# Patient Record
Sex: Female | Born: 1950 | Race: White | Hispanic: No | Smoking: Current every day smoker
Health system: Southern US, Community
[De-identification: ages and names within clinical notes are randomized; demographics above are authoritative.]

## PROBLEM LIST (undated history)

## (undated) DIAGNOSIS — M79606 Pain in leg, unspecified: Secondary | ICD-10-CM

## (undated) DIAGNOSIS — F319 Bipolar disorder, unspecified: Secondary | ICD-10-CM

## (undated) DIAGNOSIS — I8390 Asymptomatic varicose veins of unspecified lower extremity: Secondary | ICD-10-CM

## (undated) HISTORY — DX: Bipolar disorder, unspecified: F31.9

## (undated) HISTORY — DX: Pain in leg, unspecified: M79.606

## (undated) HISTORY — DX: Asymptomatic varicose veins of unspecified lower extremity: I83.90

---

## 1982-07-16 HISTORY — PX: ABDOMINAL HYSTERECTOMY: SHX81

## 1998-07-16 HISTORY — PX: CARPAL TUNNEL RELEASE: SHX101

## 2011-04-20 ENCOUNTER — Encounter: Payer: Self-pay | Admitting: Surgery

## 2011-04-23 ENCOUNTER — Ambulatory Visit (INDEPENDENT_AMBULATORY_CARE_PROVIDER_SITE_OTHER): Payer: BC Managed Care – PPO | Admitting: Vascular Surgery

## 2011-04-23 ENCOUNTER — Ambulatory Visit (INDEPENDENT_AMBULATORY_CARE_PROVIDER_SITE_OTHER): Payer: BC Managed Care – PPO | Admitting: Surgery

## 2011-04-23 ENCOUNTER — Encounter: Payer: Self-pay | Admitting: Surgery

## 2011-04-23 VITALS — BP 130/76 | HR 68 | Resp 22 | Ht 64.0 in | Wt 140.0 lb

## 2011-04-23 DIAGNOSIS — M79609 Pain in unspecified limb: Secondary | ICD-10-CM

## 2011-04-23 DIAGNOSIS — M7989 Other specified soft tissue disorders: Secondary | ICD-10-CM

## 2011-04-23 DIAGNOSIS — I83893 Varicose veins of bilateral lower extremities with other complications: Secondary | ICD-10-CM

## 2011-04-23 NOTE — Progress Notes (Signed)
Vascular and Vein Specialist of [facility]   Patient name: Joyce Reyes MRN: [id number] DOB: 02-20-1951 Sex: female   Referred by: Dr. Leonor Liv  Reason for referral:  Chief Complaint  Patient presents with  . New Evaluation    VV  new patient eval   REF-->>  Dr. Juleen China,   Pt has been wearing compression hose.    HISTORY OF PRESENT ILLNESS: This is a 60 year old female I am seeing in consultation for evaluation of leg swelling and painful for her gross veins. She has a history of undergoing sclerotherapy in the left leg many years ago which have improved cosmetic appearance of her left leg however in the left leg she does does complain of significant swelling particularly at the end of the day and pain behind her left knee where she develops prominent varicosities. She has never undergone laser ablation in the past. More recently within the past 3 months she has developed pain in the right leg. She describes as burning and throbbing. She has tried taking ibuprofen 600 mg 3 times a day without benefit. She does have bilateral thigh-high compression stockings 20-30 mm compression grade which she has been wearing without significant relief. She states that in today she's gained approximately 10 pounds of fluid weight. She cannot get her pants on in the afternoon that she were to morning because of fluid buildup  Past Medical History  Diagnosis Date  . Varicose veins   . Leg pain   . Bipolar depression     Past Surgical History  Procedure Date  . Abdominal hysterectomy 1984    vaginal hysterectomy  . Carpal tunnel release 2000    Bilateral    History   Social History  . Marital Status: Unknown    Spouse Name: N/A    Number of Children: N/A  . Years of Education: N/A   Occupational History  . Not on file.   Social History Main Topics  . Smoking status: Current Everyday Smoker -- 0.5 packs/day for 40 years    Types: Cigarettes  . Smokeless tobacco: Not on file  . Alcohol Use:  No  . Drug Use: No  . Sexually Active: Not on file   Other Topics Concern  . Not on file   Social History Narrative  . No narrative on file    History reviewed. No pertinent family history.  Allergies as of 04/23/2011 - Review Complete 04/23/2011  Allergen Reaction Noted  . Adhesive (tape)  04/20/2011    Current Outpatient Prescriptions on File Prior to Visit  Medication Sig Dispense Refill  . Cholecalciferol (VITAMIN D3) 5000 UNITS CAPS Take 1 tablet by mouth daily.        . Multiple Vitamins-Minerals (WOMENS 50+ ADVANCED PO) Take 1 tablet by mouth daily.        . nicotine (NICOTROL) 10 MG inhaler Inhale 1 puff into the lungs 6 (six) times daily.        . Omega-3 Fatty Acids (FISH OIL) 1000 MG CAPS Take 4 capsules by mouth daily.       . vitamin B-12 (CYANOCOBALAMIN) 1000 MCG tablet Take 1,000 mcg by mouth daily.           REVIEW OF SYSTEMS: Cardiovascular: No chest pain, chest pressure, palpitations, orthopnea, or dyspnea on exertion. No claudication or rest pain,  No history of DVT Pulmonary: No productive cough, asthma or wheezing. Neurologic: No weakness, paresthesias, aphasia, or amaurosis. No dizziness. Hematologic: No bleeding problems or clotting disorders. Musculoskeletal:  No joint pain or joint swelling. Gastrointestinal: No blood in stool or hematemesis Genitourinary: No dysuria or hematuria. Psychiatric:: Positive for depression Integumentary: No rashes or ulcers. Constitutional: No fever or chills.  PHYSICAL EXAMINATION: General: The patient appears their stated age.  Vital signs are BP 130/76  Pulse 68  Resp 22  Ht 5\' 4"  (1.626 m)  Wt 140 lb (63.504 kg)  BMI 24.03 kg/m2  SpO2 99% Pulmonary: There is a good air exchange bilaterally without wheezing or rales. Abdomen: Soft and non-tender with normal pitch bowel sounds. Musculoskeletal: There are no major deformities.  There is no significant extremity pain. Neurologic: No focal weakness or paresthesias  are detected, Skin: There are no ulcer or rashes noted. Psychiatric: The patient has normal affect. Cardiovascular: There is a regular rate and rhythm without significant murmur appreciated. No carotid bruits VEIN: Patient has prominent varicosities on the right anterior medial lower leg and the left posterior medial leg. There is an area of warm induration on the right medial calf which is very tender to touch  Diagnostic Studies: Reflux evaluation exam was performed today which shows the diameter of the right greater saphenous vein to be between 25 and 0.7 cm the left measures between 0.4 and 0.5 cm there is mild reflux in the right deep system with no reflux in the left deep system there is reflux in the right greater saphenous vein to the saphenofemoral junction distally there is reflux in the left greater saphenous vein around the knee  Outside Studies/Documentation Historical records were reviewed.  They showed thrombophlebitis of the right leg with significant lower extremity edema and varicosities varicosities  Medication Changes: None  Assessment:  Bilateral reflux with thrombophlebitis on the right Plan: For thrombophlebitis I recommended warm compresses as well as ibuprofen 600 mg 3 times daily. In addition we'll make sure that she wears her 20-30 mm gradient thigh-high compression stockings to help with her swelling. She will come back for reevaluation in 3 months to see Dr. Hart Rochester for consideration of endovenous laser ablation and phlebectomy of her varicosities     V. Charlena Cross, M.D. [facility] [place] Office: [phone]

## 2011-04-25 ENCOUNTER — Encounter: Payer: Self-pay | Admitting: Vascular Surgery

## 2011-04-30 NOTE — Procedures (Unsigned)
LOWER EXTREMITY VENOUS REFLUX EXAM  INDICATION:  Lower extremity pain, swelling, and varicose veins.  EXAM:  Using color-flow imaging and pulse Doppler spectral analysis, the bilateral common femoral, superficial femoral, popliteal, posterior tibial, greater and lesser saphenous veins are evaluated.  There is evidence suggesting deep venous insufficiency in the right lower extremity.  There is no evidence suggesting deep venous insufficiency in the left lower extremity.  The right saphenofemoral junction is not competent with Reflux of >561milliseconds. The left saphenofemoral junction is competent.  The right and left GSV's are not competent with Reflux of >543milliseconds with the caliber as described below.   The right proximal short saphenous vein demonstrates competency.  The left proximal short saphenous vein demonstrates incompetency with diameter measurements ranging from 0.25 cm to 0.27 cm.  GSV Diameter (used if found to be incompetent only)                                                     Right      Left Proximal Greater Saphenous Vein                     0.70 cm    0.54 cm Proximal-to-mid-thigh                               0.48 cm    0.33 cm Mid thigh                                           0.58 cm    0.38 cm Mid-distal thigh                                    cm         cm Distal thigh                                        0.56 cm    0.39 cm Knee                                                0.85 cm    0.32 cm  IMPRESSION: 1. The bilateral greater saphenous veins are not competent with reflux     >559milliseconds. 2. The bilateral great saphenous veins are not tortuous. 3. The deep venous system of the right lower extremity is competent     with reflux of >524milliseconds. 4. The deep venous system of the left lower extremity is competent. 5. The right small saphenous vein is competent. 6. The left small saphenous vein is not competent  with reflux of >500     milliseconds with the calibers as described above.  ___________________________________________ V. Charlena Cross, MD  SH/MEDQ  D:  04/23/2011  T:  04/23/2011  Job:  [id number]

## 2011-05-11 ENCOUNTER — Encounter: Payer: Self-pay | Admitting: Family Medicine

## 2011-06-14 ENCOUNTER — Telehealth: Payer: Self-pay | Admitting: *Deleted

## 2011-06-14 NOTE — Telephone Encounter (Signed)
Returned Elizebeth Pamer voice message from this morning regarding seeing Dr. Hart Rochester for 3 month follow up visit very soon instead of waiting for VV 3 month follow up appointment scheduled with Dr. Hart Rochester on 07-30-2011.  Ms. Yearwood states pain and swelling are significantly worse and she cannot complete the 3 months of conservative treatment.  Explained to Ms. Krenzer that insurance carriers would not pay for varicose vein treatments if 3 months of conservative therapy was not completed and documented.  Ms. Haworth did not verbalize understanding and was verbally hostile and hung up.

## 2011-06-14 NOTE — Telephone Encounter (Signed)
Returning Ms. Boulier's voice message form 4:33PMthis afternoon. Hetal Proano states she called [employer] of [place] and told them she cannot complete 3 months of conservative therapy due to increased pain.  Ms. Sanko states that [employer] of [place] asked her to have an urgent request form sent into the authorization department.  Ms. Demeritt states that she is unsure if this authorization request form is for follow up office visit or for vein procedure.  Told Ms. Pociask that either I or Clementeen Hoof, RN  would speak with Dr. Hart Rochester on Monday regarding her case and follow up with [employer] of [place] and call her back on Monday, Dec. 3.

## 2011-06-14 NOTE — Telephone Encounter (Signed)
Called Joyce Reyes to continue our conversation and to give her clinical information and reassurance. Reviewed with her Dr. Estanislado Spire office notes from her initial visit and duplex results that she was experiencing thrombophlebitis in right calf.  Explained that thrombophlebitis was in the superificial venous system and was not life threatening or limb threatening.  Explained the difference between thrombophlebitis and deep vein thrombosis and attempted to reassure her.  Ms. Bhatt did not verbalize understanding, was verbally hostile and demanded a "500% guarantee that a blood clot would not break off and cause me to die".  Ms. Smethurst threatened legal action if anything adverse happened to her.  Told her I would talk with Dr. Hart Rochester on Monday, Dec. 3, 2012 (his next office day) and convey her fears and concerns and would call her back on Monday, Dec.3 with a response/plan.

## 2011-07-20 ENCOUNTER — Encounter: Payer: Self-pay | Admitting: Vascular Surgery

## 2011-07-23 ENCOUNTER — Encounter: Payer: Self-pay | Admitting: Vascular Surgery

## 2011-07-23 ENCOUNTER — Ambulatory Visit (INDEPENDENT_AMBULATORY_CARE_PROVIDER_SITE_OTHER): Payer: BC Managed Care – PPO | Admitting: Vascular Surgery

## 2011-07-23 VITALS — BP 126/70 | HR 84 | Resp 18 | Ht 62.5 in | Wt 125.0 lb

## 2011-07-23 DIAGNOSIS — I83893 Varicose veins of bilateral lower extremities with other complications: Secondary | ICD-10-CM

## 2011-07-23 NOTE — Progress Notes (Signed)
Subjective:     Patient ID: Joyce Reyes, female   DOB: Feb 25, 1951, 61 y.o.   MRN: [id number]  HPI this 61 year old female returns for further evaluation of her severe venous insufficiency of both lower extremities she was seen by Dr. Myra Gianotti 3 months ago. She has had superficial thrombophlebitis involving a varicosity in the right medial calf area which has now resolved. She has had no DVT. He has swelling in both lower extremities which is quite significant and worsens as the day progresses despite wearing long leg elastic compression stockings 20-30 mm gradient. He is unable to elevate her legs much while working she does try ibuprofen for pain. Despite these measures she continues to have significant discomfort in both legs and swelling right worse than left.  Past Medical History  Diagnosis Date  . Varicose veins   . Leg pain   . Bipolar depression     History  Substance Use Topics  . Smoking status: Current Everyday Smoker -- 0.5 packs/day for 40 years    Types: Cigarettes  . Smokeless tobacco: Never Used  . Alcohol Use: No    No family history on file.  Allergies  Allergen Reactions  . Adhesive (Tape)     Current outpatient prescriptions:Cholecalciferol (VITAMIN D3) 5000 UNITS CAPS, Take 1 tablet by mouth daily.  , Disp: , Rfl: ;  Multiple Vitamins-Minerals (WOMENS 50+ ADVANCED PO), Take 1 tablet by mouth daily.  , Disp: , Rfl: ;  nicotine (NICOTROL) 10 MG inhaler, Inhale 1 puff into the lungs 6 (six) times daily.  , Disp: , Rfl: ;  Omega-3 Fatty Acids (FISH OIL) 1000 MG CAPS, Take 4 capsules by mouth daily. , Disp: , Rfl:  St Johns Wort 300 MG CAPS, Take by mouth 2 (two) times daily.  , Disp: , Rfl: ;  vitamin B-12 (CYANOCOBALAMIN) 1000 MCG tablet, Take 1,000 mcg by mouth daily.  , Disp: , Rfl:   BP 126/70  Pulse 84  Resp 18  Ht 5' 2.5" (1.588 m)  Wt 125 lb (56.7 kg)  BMI 22.50 kg/m2  Body mass index is 22.50 kg/(m^2).           Review of Systems       Objective:   Physical Exam blood pressure 126/70 heart rate 84 respirations 18 General well-developed well-nourished female in no apparent distress Lungs no rhonchi or wheezing Cardiovascular regular and no murmurs carotid pulses 3+ no audible bruits Lower extremity exam right leg has bulging varicosities beginning in the distal thigh medially over the great saphenous system extending into the medial calf over the pretibial region as well as posterior calf down to the ankle where there is 1+ edema. No active ulcerations are noted. No active superficial thrombophlebitis is noted. Left leg has a similar distribution of bulging varicosities in the medial distal thigh extending into the medial calf and posteriorly into the gastroc area. She has 1+ edema on the left.  Venous duplex exam performed in October of 2012 reveals gross reflux both great saphenous systems from the knee to the saphenofemoral junction. There is reflux in the left small saphenous system but it is a small vein     Assessment:     Patient has severe venous insufficiency of both legs with history of thrombophlebitis in right leg and edema bilaterally not improved by conservative measures. This is definitely affecting her daily living.    Plan:     Patient needs #1 laser ablation right great saphenous vein with greater  than 20 stab phlebectomy followed by #2 laser ablation left great saphenous vein followed by greater than 20 stab phlebectomy will proceed with precertification to perform this in the near future

## 2011-07-30 ENCOUNTER — Ambulatory Visit: Payer: BC Managed Care – PPO | Admitting: Vascular Surgery

## 2011-08-03 ENCOUNTER — Encounter: Payer: Self-pay | Admitting: Vascular Surgery

## 2011-08-06 ENCOUNTER — Encounter: Payer: Self-pay | Admitting: Vascular Surgery

## 2011-08-06 ENCOUNTER — Ambulatory Visit (INDEPENDENT_AMBULATORY_CARE_PROVIDER_SITE_OTHER): Payer: BC Managed Care – PPO | Admitting: Vascular Surgery

## 2011-08-06 VITALS — BP 105/55 | HR 68 | Resp 24 | Ht 62.5 in | Wt 128.0 lb

## 2011-08-06 DIAGNOSIS — I83893 Varicose veins of bilateral lower extremities with other complications: Secondary | ICD-10-CM

## 2011-08-06 NOTE — Progress Notes (Signed)
Laser Ablation Procedure      Date: 08/06/2011    Joyce Reyes DOB:[birth year]  Consent signed: Yes  Surgeon:J.D. Hart Rochester  Procedure: Laser Ablation: right Greater Saphenous Vein  BP 105/55  Pulse 68  Resp 24  Ht 5' 2.5" (1.588 m)  Wt 128 lb (58.06 kg)  BMI 23.04 kg/m2  Start time: 1:25   End time: 2:45  Tumescent Anesthesia: 400 cc 0.9% NaCl with 50 cc Lidocaine HCL with 1% Epi and 15 cc 8.4% NaHCO3  Local Anesthesia: 15 cc Lidocaine HCL and NaHCO3 (ratio 2:1)  Pulsed mode:yes Watts 15 Seconds 1 Pulses:1 Total Pulses:142 Total Energy: 2089 Total Time: 2:19   Stab Phlebectomy: greater than 20 Sites: Thigh and Calf  Patient tolerated procedure well: Yes  Notes:   Description of Procedure:  After marking the course of the saphenous vein and the secondary varicosities in the standing position, the patient was placed on the operating table in the supine position, and the right leg was prepped and draped in sterile fashion. Local anesthetic was administered, and under ultrasound guidance the saphenous vein was accessed with a micro needle and guide wire; then the micro puncture sheath was placed. A guide wire was inserted to the saphenofemoral junction, followed by a 5 french sheath.  The position of the sheath and then the laser fiber below the junction was confirmed using the ultrasound and visualization of the aiming beam.  Tumescent anesthesia was administered along the course of the saphenous vein using ultrasound guidance. Protective laser glasses were placed on the patient, and the laser was fired at 15 watt pulsed mode advancing 1-2 mm per sec.  For a total of 2089 joules.  A steri strip was applied to the puncture site.  The patient was then put into Trendelenburg position.  Local anesthetic was utilized overlying the marked varicosities.  Greater than 20 stab wounds were made using the tip of an 11 blade; and using the vein hook,  The phlebectomies were performed using a hemostat  to avulse these varicosities.  Adequate hemostasis was achieved, and steri strips were applied to the stab wound.     ABD pads and thigh high compression stockings were applied.  Ace wrap bandages were applied over the phlebectomy sites and at the top of the saphenofemoral junction.  Blood loss was less than 15 cc.  The patient ambulated out of the operating room having tolerated the procedure well.

## 2011-08-06 NOTE — Progress Notes (Signed)
Patient ID: Joyce Reyes, female   DOB: [birth year], 61 y.o.   MRN: [id number]

## 2011-08-06 NOTE — Progress Notes (Signed)
Subjective:     Patient ID: Joyce Reyes, female   DOB: 31-Jul-1950, 61 y.o.   MRN: [id number]  HPI this 61 year old female had laser ablation of her right great saphenous vein from the knee to the saphenofemoral junction and approximately 30 stab phlebectomy performed in the thigh and calf area under local tumescent anesthesia. She tolerated the procedure well. 2100 J of energy was utilized for the ablation.   Review of Systems     Objective:   Physical ExamBP 105/55  Pulse 68  Resp 24  Ht 5' 2.5" (1.588 m)  Wt 128 lb (58.06 kg)  BMI 23.04 kg/m2    Assessment:     Well-tolerated laser ablation right great saphenous vein with greater than 20 stab phlebectomy under local tumescent anesthesia    Plan:     Return in one week for venous duplex exam to confirm closure right great saphenous vein. We'll been rescheduled for same procedure and contralateral left leg

## 2011-08-07 ENCOUNTER — Encounter: Payer: Self-pay | Admitting: Vascular Surgery

## 2011-08-07 ENCOUNTER — Telehealth: Payer: Self-pay | Admitting: *Deleted

## 2011-08-07 NOTE — Telephone Encounter (Signed)
Reached patient at home. Doing well. A bit uncomfortable during the night but she is using ice and taking Ibuprofen as directed. Was in good spirits. Reminded her of her fu visit next week.

## 2011-08-10 ENCOUNTER — Encounter: Payer: Self-pay | Admitting: Vascular Surgery

## 2011-08-13 ENCOUNTER — Ambulatory Visit (INDEPENDENT_AMBULATORY_CARE_PROVIDER_SITE_OTHER): Payer: BC Managed Care – PPO | Admitting: Vascular Surgery

## 2011-08-13 ENCOUNTER — Other Ambulatory Visit: Payer: Self-pay | Admitting: *Deleted

## 2011-08-13 ENCOUNTER — Encounter: Payer: Self-pay | Admitting: Vascular Surgery

## 2011-08-13 ENCOUNTER — Encounter (INDEPENDENT_AMBULATORY_CARE_PROVIDER_SITE_OTHER): Payer: BC Managed Care – PPO | Admitting: *Deleted

## 2011-08-13 VITALS — BP 109/64 | HR 72 | Resp 18 | Ht 63.0 in | Wt 127.0 lb

## 2011-08-13 DIAGNOSIS — I83893 Varicose veins of bilateral lower extremities with other complications: Secondary | ICD-10-CM

## 2011-08-13 DIAGNOSIS — I83009 Varicose veins of unspecified lower extremity with ulcer of unspecified site: Secondary | ICD-10-CM

## 2011-08-13 DIAGNOSIS — L97909 Non-pressure chronic ulcer of unspecified part of unspecified lower leg with unspecified severity: Secondary | ICD-10-CM

## 2011-08-13 NOTE — Progress Notes (Signed)
Subjective:     Patient ID: Joyce Reyes, female   DOB: October 20, 1950, 61 y.o.   MRN: [id number]  HPI this 61 year old female returns 1 week post laser ablation right great saphenous vein and 30 stab phlebectomy of secondary varicosities for painful venous hypertension and air across these right leg. She states that her right ankle is less tight than preoperatively. She has been having no right buttock pain like she had prior to the procedure. She's having some mild to moderate discomfort along the course of the right great saphenous vein.   Review of Systems     Objective:   Physical ExamBP 109/64  Pulse 72  Resp 18  Ht 5\' 3"  (1.6 m)  Wt 127 lb (57.607 kg)  BMI 22.50 kg/m2 There is mild ecchymosis along the course of the right great saphenous vein. It is mildly tender to palpation. The multiple stab phlebectomy wounds in the calf and thigh are all healing satisfactorily. There is no distal edema noted. No ulcerations are present. Left leg continues to have bulging varicosities in the medial calf and thigh.    Assessment:     I ordered a venous duplex exam of the right leg which are reviewed and interpreted. There is no DVT. The great saphenous vein has been closed from the knee to the saphenofemoral junction Doing well post laser ablation right great saphenous vein with multiple stab phlebectomy    Plan:     Will proceed with similar procedure on the contralateral left leg on March 4

## 2011-08-15 ENCOUNTER — Other Ambulatory Visit: Payer: Self-pay | Admitting: *Deleted

## 2011-08-15 DIAGNOSIS — I83899 Varicose veins of unspecified lower extremities with other complications: Secondary | ICD-10-CM

## 2011-08-20 NOTE — Procedures (Unsigned)
DUPLEX DEEP VENOUS EXAM - LOWER EXTREMITY  INDICATION:  One week status post EVLT of right GSV.  HISTORY:  Edema:  No. Trauma/Surgery:  EVLT. Pain:  Tenderness. PE:  No. Previous DVT:  No. Anticoagulants:  No. Other:  DUPLEX EXAM:               CFV   SFV   PopV  PTV    GSV               R  L  R  L  R  L  R   L  R  L Thrombosis    o     o     o     o      + Spontaneous   +     +     +     +      o Phasic        +     +     +     +      o Augmentation  +     +     +     +      o Compressible  +     +     +     +      o Competent     +     +     +     +      o  Legend:  + - yes  o - no  p - partial  D - decreased  IMPRESSION:  Successful ablation of right greater saphenous vein from 1.7 cm distal to the junction through the knee without deep venous involvement.   _____________________________ Quita Skye Hart Rochester, M.D.  LT/MEDQ  D:  08/13/2011  T:  08/13/2011  Job:  [id number]

## 2011-09-14 ENCOUNTER — Encounter: Payer: Self-pay | Admitting: Vascular Surgery

## 2011-09-17 ENCOUNTER — Ambulatory Visit (INDEPENDENT_AMBULATORY_CARE_PROVIDER_SITE_OTHER): Payer: BC Managed Care – PPO | Admitting: Vascular Surgery

## 2011-09-17 ENCOUNTER — Encounter: Payer: Self-pay | Admitting: Vascular Surgery

## 2011-09-17 VITALS — BP 98/59 | HR 74 | Resp 18 | Ht 63.0 in | Wt 125.0 lb

## 2011-09-17 DIAGNOSIS — I83893 Varicose veins of bilateral lower extremities with other complications: Secondary | ICD-10-CM

## 2011-09-17 NOTE — Progress Notes (Signed)
Subjective:     Patient ID: Joyce Reyes, female   DOB: 06-20-1951, 61 y.o.   MRN: [id number]  HPI this 61 year old female had laser ablation of left great saphenous vein with greater than 20 stab phlebectomy is in the thigh and calf area done under local tumescent anesthesia today. She tolerated the procedure well. A total of 1522 J of energy was utilized.   Review of Systems     Objective:   Physical ExamBP 98/59  Pulse 74  Resp 18  Ht 5\' 3"  (1.6 m)  Wt 125 lb (56.7 kg)  BMI 22.14 kg/m2     Assessment:     Successful laser ablation left great saphenous vein with greater than 20 stab phlebectomy of secondary varicosities down under local tumescent anesthesia    Plan:     Return in one week for a venous duplex exam left leg to confirm closure left great saphenous vein. This will complete her treatment session.

## 2011-09-17 NOTE — Progress Notes (Signed)
Laser Ablation Procedure      Date: 09/17/2011    Joyce Reyes DOB:January 30, 1951  Consent signed: Yes  Surgeon:J.D. Hart Rochester  Procedure: Laser Ablation: left Greater Saphenous Vein  BP 98/59  Pulse 74  Resp 18  Ht 5\' 3"  (1.6 m)  Wt 125 lb (56.7 kg)  BMI 22.14 kg/m2  Start time: 11:15   End time: 12:30  Tumescent Anesthesia: 400 cc 0.9% NaCl with 50 cc Lidocaine HCL with 1% Epi and 15 cc 8.4% NaHCO3  Local Anesthesia: 15 cc Lidocaine HCL and NaHCO3 (ratio 2:1)  Pulsed mode: Watts 15 Seconds 1 Pulses:1 Total Pulses:102 Total Energy: 1522 Total Time: 1:41  Stab Phlebectomy: >20 Sites: Thigh and Calf  Patient tolerated procedure well: Yes  Description of Procedure:  After marking the course of the saphenous vein and the secondary varicosities in the standing position, the patient was placed on the operating table in the supine position, and the left leg was prepped and draped in sterile fashion. Local anesthetic was administered, and under ultrasound guidance the saphenous vein was accessed with a micro needle and guide wire; then the micro puncture sheath was placed. A guide wire was inserted to the saphenofemoral junction, followed by a 5 french sheath.  The position of the sheath and then the laser fiber below the junction was confirmed using the ultrasound and visualization of the aiming beam.  Tumescent anesthesia was administered along the course of the saphenous vein using ultrasound guidance. Protective laser glasses were placed on the patient, and the laser was fired at 15 watt pulsed mode advancing 1-2 mm per sec.  For a total of 1522 joules.  A steri strip was applied to the puncture site.  The patient was then put into Trendelenburg position.  Local anesthetic was utilized overlying the marked varicosities.  Greater than 20 stab wounds were made using the tip of an 11 blade; and using the vein hook,  The phlebectomies were performed using a hemostat to avulse these varicosities.   Adequate hemostasis was achieved, and steri strips were applied to the stab wound.     ABD pads and thigh high compression stockings were applied.  Ace wrap bandages were applied over the phlebectomy sites and at the top of the saphenofemoral junction.  Blood loss was less than 15 cc.  The patient ambulated out of the operating room having tolerated the procedure well.

## 2011-09-18 ENCOUNTER — Telehealth: Payer: Self-pay | Admitting: *Deleted

## 2011-09-18 ENCOUNTER — Encounter: Payer: Self-pay | Admitting: Vascular Surgery

## 2011-09-18 NOTE — Telephone Encounter (Signed)
Asked her to call me back is she is experiencing any problems or concerns. Reminded her of her fu appt.

## 2011-09-24 ENCOUNTER — Encounter: Payer: Self-pay | Admitting: Vascular Surgery

## 2011-09-25 ENCOUNTER — Ambulatory Visit (INDEPENDENT_AMBULATORY_CARE_PROVIDER_SITE_OTHER): Payer: BC Managed Care – PPO | Admitting: Vascular Surgery

## 2011-09-25 ENCOUNTER — Encounter: Payer: Self-pay | Admitting: Vascular Surgery

## 2011-09-25 VITALS — BP 110/77 | HR 80 | Resp 16 | Ht 63.5 in | Wt 125.0 lb

## 2011-09-25 DIAGNOSIS — M7989 Other specified soft tissue disorders: Secondary | ICD-10-CM

## 2011-09-25 DIAGNOSIS — M79609 Pain in unspecified limb: Secondary | ICD-10-CM

## 2011-09-25 DIAGNOSIS — I83893 Varicose veins of bilateral lower extremities with other complications: Secondary | ICD-10-CM

## 2011-09-25 DIAGNOSIS — Z48812 Encounter for surgical aftercare following surgery on the circulatory system: Secondary | ICD-10-CM

## 2011-09-25 NOTE — Progress Notes (Signed)
Subjective:     Patient ID: Joyce Reyes, female   DOB: 04/15/1951, 61 y.o.   MRN: [id number]  HPI this 61 year old female returns 1 week post laser ablation left great saphenous vein with greater than 20 stab lobectomy  for initial followup. She has had mild discomfort along the course of the left great saphenous vein from the proximal to distal thigh. She denies any change in distal edema. She's had minimal discomfort associated with the stab phlebectomy sites.  Review of Systems     Objective:   Physical ExamBP 110/77  Pulse 80  Resp 16  Ht 5' 3.5" (1.613 m)  Wt 125 lb (56.7 kg)  BMI 21.80 kg/m2  Left leg has mild to moderate ecchymosis from the saphenofemoral junction area in the proximal thigh down to the mid knee level. There is mild discomfort to deep palpation. Stab phlebectomy sites are healing well. There's no distal edema.  Today I ordered a venous duplex exam of the left leg which are reviewed and interpreted. The left great saphenous vein was totally closed. There is no DVT.     Assessment:     Successful laser ablation left great saphenous vein with multiple stab phlebectomy for painful varicosities    Plan:     Return to see [place] on a when necessary basis

## 2011-10-05 NOTE — Procedures (Unsigned)
DUPLEX DEEP VENOUS EXAM - LOWER EXTREMITY  INDICATION:  Left lower extremity varicose veins, pain, swelling.  HISTORY:  Edema:  Yes Trauma/Surgery:  Left endovenous laser ablation on 09/17/2011. Pain:  Yes PE:  No Previous DVT:  No Anticoagulants:  No Other:  DUPLEX EXAM:               CFV   SFV   PopV  PTV    GSV               R  L  R  L  R  L  R   L  R  L Thrombosis    0  0     0     0      0     + Spontaneous   +  +     +     +      +     0 Phasic        +  +     +     +      +     + Augmentation  +  +     +     +      +     0 Compressible  +  +     +     +      +     0/P Competent     +  +     0     +      +  Legend:  + - yes  o - no  p - partial  D - decreased  IMPRESSION: 1. No evidence of deep vein thrombosis identified in the left lower     extremity, all deep veins are patent and compressible. 2. Good post ablation results of the left great saphenous vein, the     length ablated.  Note is made of partial compressibility at the     proximal segment from the junction to the proximal mid thigh. 3. Patent and compressible right common femoral vein and left femoral     saphenous vein.        _____________________________ Quita Skye. Hart Rochester, M.D.  SH/MEDQ  D:  09/25/2011  T:  09/25/2011  Job:  [id number]

## 2012-05-05 ENCOUNTER — Other Ambulatory Visit: Payer: Self-pay

## 2012-05-05 DIAGNOSIS — M7989 Other specified soft tissue disorders: Secondary | ICD-10-CM

## 2012-05-06 ENCOUNTER — Other Ambulatory Visit: Payer: Self-pay

## 2012-05-06 DIAGNOSIS — M7989 Other specified soft tissue disorders: Secondary | ICD-10-CM

## 2012-05-09 ENCOUNTER — Encounter: Payer: Self-pay | Admitting: Vascular Surgery

## 2012-05-12 ENCOUNTER — Ambulatory Visit (INDEPENDENT_AMBULATORY_CARE_PROVIDER_SITE_OTHER): Payer: BC Managed Care – PPO | Admitting: Vascular Surgery

## 2012-05-12 ENCOUNTER — Encounter: Payer: Self-pay | Admitting: Vascular Surgery

## 2012-05-12 VITALS — BP 133/87 | HR 65 | Resp 18 | Ht 62.5 in | Wt 130.0 lb

## 2012-05-12 DIAGNOSIS — M7989 Other specified soft tissue disorders: Secondary | ICD-10-CM

## 2012-05-12 DIAGNOSIS — I83893 Varicose veins of bilateral lower extremities with other complications: Secondary | ICD-10-CM

## 2012-05-12 DIAGNOSIS — M79609 Pain in unspecified limb: Secondary | ICD-10-CM

## 2012-05-12 NOTE — Progress Notes (Signed)
Bilateral lower extremity venous duplex for reflux performed @ [facility] 05/12/2012

## 2012-05-12 NOTE — Progress Notes (Signed)
Subjective:     Patient ID: Joyce Reyes, female   DOB: 25-May-1951, 61 y.o.   MRN: [id number]  HPI this 61 year old female is known to [place] having undergone bilateral laser ablation procedures earlier this year with multiple stab phlebectomy for painful varicosities due to gross reflux in the bilateral great saphenous veins. She states that she has had continued swelling from the groin to the feet bilateral left worse than right. She had had bilateral lower extremity edema prior to the procedure. She states that a few days ago the lower third of her left leg became painful she was unable to dorsiflex her left foot. Prior to that she states she was able to ambulate long distances. She does not wear elastic compression stockings but does elevate her legs somewhat at night.  Past Medical History  Diagnosis Date  . Varicose veins   . Leg pain   . Bipolar depression     History  Substance Use Topics  . Smoking status: Current Every Day Smoker -- 0.5 packs/day for 40 years    Types: Cigarettes  . Smokeless tobacco: Never Used  . Alcohol Use: No    No family history on file.  Allergies  Allergen Reactions  . Adhesive (Tape)     Current outpatient prescriptions:Cholecalciferol (VITAMIN D3) 5000 UNITS CAPS, Take 1 tablet by mouth daily.  , Disp: , Rfl: ;  Multiple Vitamins-Minerals (WOMENS 50+ ADVANCED PO), Take 1 tablet by mouth daily.  , Disp: , Rfl: ;  nicotine (NICOTROL) 10 MG inhaler, Inhale 1 puff into the lungs 6 (six) times daily.  , Disp: , Rfl: ;  Omega-3 Fatty Acids (FISH OIL) 1000 MG CAPS, Take 4 capsules by mouth daily. , Disp: , Rfl:  St Johns Wort 300 MG CAPS, Take by mouth 2 (two) times daily.  , Disp: , Rfl: ;  vitamin B-12 (CYANOCOBALAMIN) 1000 MCG tablet, Take 1,000 mcg by mouth daily.  , Disp: , Rfl:   BP 133/87  Pulse 65  Resp 18  Ht 5' 2.5" (1.588 m)  Wt 130 lb (58.968 kg)  BMI 23.40 kg/m2  Body mass index is 23.40 kg/(m^2).           Review of Systems denies  chest pain, dyspnea on exertion, PND, orthopnea, hemoptysis, claudication.    Objective:   Physical Exam blood pressure 133 her radius 7 heart rate 65 respirations 18 Gen.-alert and oriented x3 in no apparent distress HEENT normal for age Lungs no rhonchi or wheezing Cardiovascular regular rhythm no murmurs carotid pulses 3+ palpable no bruits audible Abdomen soft nontender no palpable masses Musculoskeletal free of  major deformities Skin clear -no rashes Neurologic normal Lower extremities 3+ femoral and dorsalis pedis pulses palpable bilaterally with 1+ edema left calf no edema right leg. There are multiple reticular and spider veins on the medial aspect of both legs and medial thigh and calf areas down to the medial malleolus. No active ulceration is noted. There are a few small varicosities remaining.  Today I ordered bilateral venous duplex exam which are reviewed and interpreted. There is no DVT. Both great saphenous veins have been successfully closed from the mid to distal thigh to the saphenofemoral junction. There is no reflux in the small saphenous veins. There is a slight amount of reflux in the very distal left great saphenous vein but only over a very short segment and vein is small caliber.       Assessment:     Successful ablation bilateral  great saphenous veins. Patient with persistent bilateral lower extremity edema-etiology unknown    Plan:     #1 continue elevation of legs at night 3-4 inches-foot of bed #2 elastic compression stockings during day #3 may require diuretic therapy by her medical doctor #4 no indication for any further treatment of her peripheral venous system

## 2012-05-17 ENCOUNTER — Emergency Department (HOSPITAL_BASED_OUTPATIENT_CLINIC_OR_DEPARTMENT_OTHER)
Admission: EM | Admit: 2012-05-17 | Discharge: 2012-05-17 | Disposition: A | Discharge status: Home / Self Care | Payer: BC Managed Care – PPO | Attending: Emergency Medicine | Admitting: Emergency Medicine

## 2012-05-17 ENCOUNTER — Encounter (HOSPITAL_BASED_OUTPATIENT_CLINIC_OR_DEPARTMENT_OTHER): Payer: Self-pay | Admitting: *Deleted

## 2012-05-17 DIAGNOSIS — F3289 Other specified depressive episodes: Secondary | ICD-10-CM | POA: Insufficient documentation

## 2012-05-17 DIAGNOSIS — Z79899 Other long term (current) drug therapy: Secondary | ICD-10-CM | POA: Insufficient documentation

## 2012-05-17 DIAGNOSIS — Z888 Allergy status to other drugs, medicaments and biological substances status: Secondary | ICD-10-CM | POA: Insufficient documentation

## 2012-05-17 DIAGNOSIS — F17200 Nicotine dependence, unspecified, uncomplicated: Secondary | ICD-10-CM | POA: Insufficient documentation

## 2012-05-17 DIAGNOSIS — F319 Bipolar disorder, unspecified: Secondary | ICD-10-CM | POA: Insufficient documentation

## 2012-05-17 DIAGNOSIS — F329 Major depressive disorder, single episode, unspecified: Secondary | ICD-10-CM | POA: Insufficient documentation

## 2012-05-17 DIAGNOSIS — M7989 Other specified soft tissue disorders: Secondary | ICD-10-CM | POA: Insufficient documentation

## 2012-05-17 NOTE — ED Notes (Signed)
Pt has noted swelling to LLE/ankle.  Pt has no injury and was seen by Dr Cyril Mourning and told to f/u with PMD.  Pt has appt later next week

## 2012-05-17 NOTE — ED Provider Notes (Signed)
History     CSN: [id number]  Arrival date & time 05/17/12  6213   First MD Initiated Contact with Patient 05/17/12 0945      Chief Complaint  Patient presents with  . Joint Swelling    (Consider location/radiation/quality/duration/timing/severity/associated sxs/prior treatment) HPI Pt presents with c/o swelling in lower leg.  She states swelling is in mid anterior shin region in the area of some spider veins.  She was seen on 10/28 by Dr. Hart Rochester- had DVT study which was negative on that day.  She has not followed up with her PMD as of yet.  No redness, no fever, no shortness of breath or chest pain.  Has been keeping legs elevated.  There are no other associated systemic symptoms, there are no other alleviating or modifying factors.   Past Medical History  Diagnosis Date  . Varicose veins   . Leg pain   . Bipolar depression     Past Surgical History  Procedure Date  . Abdominal hysterectomy 1984    vaginal hysterectomy  . Carpal tunnel release 2000    Bilateral    History reviewed. No pertinent family history.  History  Substance Use Topics  . Smoking status: Current Every Day Smoker -- 0.5 packs/day for 40 years    Types: Cigarettes  . Smokeless tobacco: Never Used  . Alcohol Use: No    OB History    Grav Para Term Preterm Abortions TAB SAB Ect Mult Living                  Review of Systems ROS reviewed and all otherwise negative except for mentioned in HPI Allergies  Adhesive  Home Medications   Current Outpatient Rx  Name Route Sig Dispense Refill  . VITAMIN D3 5000 UNITS PO CAPS Oral Take 1 tablet by mouth daily.      . WOMENS 50+ ADVANCED PO Oral Take 1 tablet by mouth daily.      Marland Kitchen NICOTINE 10 MG IN INHA Inhalation Inhale 1 puff into the lungs 6 (six) times daily.      Marland Kitchen FISH OIL 1000 MG PO CAPS Oral Take 4 capsules by mouth daily.     . ST JOHNS WORT 300 MG PO CAPS Oral Take by mouth 2 (two) times daily.      Marland Kitchen VITAMIN B-12 1000 MCG PO TABS Oral  Take 1,000 mcg by mouth daily.        BP 124/73  Pulse 74  Temp 97.8 F (36.6 C)  Resp 20  SpO2 99% Vitals reviewed Physical Exam Physical Examination: General appearance - alert, well appearing, and in no distress Mental status - alert, oriented to person, place, and time Eyes - no scleral icterus, no conjunctival injection Mouth - mucous membranes moist, pharynx normal without lesions Chest - clear to auscultation, no wheezes, rales or rhonchi, symmetric air entry Heart - normal rate, regular rhythm, normal S1, S2, no murmurs, rubs, clicks or gallops Extremities - peripheral pulses normal, no pedal edema, no clubbing or cyanosis, varicose/spider veins on anterior right shin with localized area of swelling under that- no distal swelling or pedal edema Skin - normal coloration and turgor, no rashes  ED Course  Procedures (including critical care time)  Labs Reviewed - No data to display No results found.   1. Swelling of left lower extremity       MDM  Pt presents with concern for swelling of right leg- she has varicose/spider veins and had DVT study  yesterday which was negative for DVT.  Discharged with strict return precautions.  Pt agreeable with plan.        Ethelda Chick, MD 05/17/12 1459

## 2012-05-17 NOTE — ED Notes (Signed)
Pt presents to ED today with ankle swelling for the last week.  Pt reports no injury or trauma to joint.  Pt was seen by Dr Cyril Mourning and was told to follow up with PMD.

## 2012-09-09 ENCOUNTER — Emergency Department (HOSPITAL_BASED_OUTPATIENT_CLINIC_OR_DEPARTMENT_OTHER)
Admission: EM | Admit: 2012-09-09 | Discharge: 2012-09-09 | Disposition: A | Discharge status: Home / Self Care | Payer: BC Managed Care – PPO | Attending: Emergency Medicine | Admitting: Emergency Medicine

## 2012-09-09 ENCOUNTER — Encounter (HOSPITAL_BASED_OUTPATIENT_CLINIC_OR_DEPARTMENT_OTHER): Payer: Self-pay | Admitting: *Deleted

## 2012-09-09 ENCOUNTER — Emergency Department (HOSPITAL_BASED_OUTPATIENT_CLINIC_OR_DEPARTMENT_OTHER): Payer: BC Managed Care – PPO

## 2012-09-09 DIAGNOSIS — S93409A Sprain of unspecified ligament of unspecified ankle, initial encounter: Secondary | ICD-10-CM | POA: Insufficient documentation

## 2012-09-09 DIAGNOSIS — Z8679 Personal history of other diseases of the circulatory system: Secondary | ICD-10-CM | POA: Insufficient documentation

## 2012-09-09 DIAGNOSIS — Z79899 Other long term (current) drug therapy: Secondary | ICD-10-CM | POA: Insufficient documentation

## 2012-09-09 DIAGNOSIS — Z8739 Personal history of other diseases of the musculoskeletal system and connective tissue: Secondary | ICD-10-CM | POA: Insufficient documentation

## 2012-09-09 DIAGNOSIS — Z8659 Personal history of other mental and behavioral disorders: Secondary | ICD-10-CM | POA: Insufficient documentation

## 2012-09-09 DIAGNOSIS — Y929 Unspecified place or not applicable: Secondary | ICD-10-CM | POA: Insufficient documentation

## 2012-09-09 DIAGNOSIS — S93401A Sprain of unspecified ligament of right ankle, initial encounter: Secondary | ICD-10-CM

## 2012-09-09 DIAGNOSIS — F17200 Nicotine dependence, unspecified, uncomplicated: Secondary | ICD-10-CM | POA: Insufficient documentation

## 2012-09-09 DIAGNOSIS — Y9389 Activity, other specified: Secondary | ICD-10-CM | POA: Insufficient documentation

## 2012-09-09 DIAGNOSIS — W1789XA Other fall from one level to another, initial encounter: Secondary | ICD-10-CM | POA: Insufficient documentation

## 2012-09-09 MED ORDER — HYDROCODONE-ACETAMINOPHEN 5-325 MG PO TABS: 1.0000 | ORAL_TABLET | Freq: Four times a day (QID) | ORAL | Status: AC | PRN

## 2012-09-09 MED ORDER — HYDROCODONE-ACETAMINOPHEN 5-325 MG PO TABS
1.0000 | ORAL_TABLET | Freq: Once | ORAL | Status: AC
  Administered 2012-09-09: 1 via ORAL
  Filled 2012-09-09: qty 1

## 2012-09-09 NOTE — Progress Notes (Signed)
Applied ASO ankle velcro ankle splint to patient's right ankle, patient also given crutches & crutches education.

## 2012-09-09 NOTE — ED Notes (Signed)
Right ankle pain and swelling after a step stool collapsed while standing on it

## 2012-09-09 NOTE — ED Provider Notes (Signed)
History     CSN: [id number]  Arrival date & time 09/09/12  1191   First MD Initiated Contact with Patient 09/09/12 1110      Chief Complaint  Patient presents with  . Ankle Injury    (Consider location/radiation/quality/duration/timing/severity/associated sxs/prior treatment) HPI This is a 62 year old female who was standing on a stepstool this morning when it collapsed and her right foot came down onto the floor. She is not exactly sure of the mechanism of injury, that is twisting or inversion, etc. She is now having moderate pain in that ankle, worse with ambulation. There is associated ecchymosis and mild swelling. She denies other injury.  Past Medical History  Diagnosis Date  . Varicose veins   . Leg pain   . Bipolar depression     Past Surgical History  Procedure Laterality Date  . Abdominal hysterectomy  1984    vaginal hysterectomy  . Carpal tunnel release  2000    Bilateral    History reviewed. No pertinent family history.  History  Substance Use Topics  . Smoking status: Current Every Day Smoker -- 0.50 packs/day for 40 years    Types: Cigarettes  . Smokeless tobacco: Never Used  . Alcohol Use: No    OB History   Grav Para Term Preterm Abortions TAB SAB Ect Mult Living                  Review of Systems  All other systems reviewed and are negative.    Allergies  Adhesive  Home Medications   Current Outpatient Rx  Name  Route  Sig  Dispense  Refill  . Cholecalciferol (VITAMIN D3) 5000 UNITS CAPS   Oral   Take 1 tablet by mouth daily.           . Multiple Vitamins-Minerals (WOMENS 50+ ADVANCED PO)   Oral   Take 1 tablet by mouth daily.           . nicotine (NICOTROL) 10 MG inhaler   Inhalation   Inhale 1 puff into the lungs 6 (six) times daily.           . Omega-3 Fatty Acids (FISH OIL) 1000 MG CAPS   Oral   Take 4 capsules by mouth daily.          . St Johns Wort 300 MG CAPS   Oral   Take by mouth 2 (two) times daily.           . vitamin B-12 (CYANOCOBALAMIN) 1000 MCG tablet   Oral   Take 1,000 mcg by mouth daily.             BP 109/65  Pulse 59  Temp(Src) 98.1 F (36.7 C) (Oral)  Resp 18  Ht 5' 2.5" (1.588 m)  Wt 130 lb (58.968 kg)  BMI 23.38 kg/m2  SpO2 99%  Physical Exam General: Well-developed, well-nourished female in no acute distress; appearance consistent with age of record HENT: normocephalic, atraumatic Eyes: pupils equal round and reactive to light; extraocular muscles intact Neck: supple; nontender Heart: regular rate and rhythm Lungs: clear to auscultation bilaterally Abdomen: soft; nondistended Extremities: No deformity; ecchymosis and tenderness of right ankle without bony deformity or significant swelling, right foot distally neurovascularly intact Neurologic: Awake, alert and oriented; motor function intact in all extremities and symmetric; no facial droop Skin: Warm and dry Psychiatric: Normal mood and affect    ED Course  Procedures (including critical care time)     MDM  Nursing  notes and vitals signs, including pulse oximetry, reviewed.  Summary of this visit's results, reviewed by myself:   Imaging Studies: Dg Ankle Complete Right  09/09/2012  *RADIOLOGY REPORT*  Clinical Data: Pain post trauma  RIGHT ANKLE - COMPLETE 3+ VIEW  Comparison: None.  Findings: Frontal, oblique, and lateral views were obtained.  There is no acute fracture. There is a small joint effusion.  Ankle mortise appears intact.  There is evidence of old trauma along the posterior calcaneus with remodelling and sclerosis.  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  No erosive change or bony destruction.  IMPRESSION: Osteoarthritic change. Evidence of old trauma along the posterior calcaneus.  No acute fracture apparent.  Small joint effusion, a finding that could indicate underlying ligamentous injury.   Original Report Authenticated By: Bretta Bang,  M.D.             Hanley Seamen, MD 09/09/12 1122

## 2013-05-19 ENCOUNTER — Ambulatory Visit: Payer: Self-pay | Admitting: Podiatrist

## 2013-06-09 ENCOUNTER — Ambulatory Visit (INDEPENDENT_AMBULATORY_CARE_PROVIDER_SITE_OTHER): Payer: BC Managed Care – PPO | Admitting: Podiatrist

## 2013-06-09 ENCOUNTER — Encounter: Payer: Self-pay | Admitting: Podiatrist

## 2013-06-09 VITALS — BP 114/65 | HR 76 | Resp 18

## 2013-06-09 DIAGNOSIS — M7750 Other enthesopathy of unspecified foot: Secondary | ICD-10-CM

## 2013-06-09 DIAGNOSIS — B351 Tinea unguium: Secondary | ICD-10-CM

## 2013-06-09 DIAGNOSIS — M65979 Unspecified synovitis and tenosynovitis, unspecified ankle and foot: Secondary | ICD-10-CM

## 2013-06-09 DIAGNOSIS — M722 Plantar fascial fibromatosis: Secondary | ICD-10-CM

## 2013-06-09 DIAGNOSIS — M659 Synovitis and tenosynovitis, unspecified: Secondary | ICD-10-CM

## 2013-06-09 NOTE — Progress Notes (Signed)
MRN: [id number] Name: Joyce Reyes  Sex: female Age: 62 y.o. DOB: [birth year]  Provider: Delories Heinz  Allergies: Adhesive   Chief Complaint  Patient presents with  . Ingrown Toenail    big toenails on both feet  . Foot Problem    both feet     HPI: Patient is 62 y.o. female who presents today for painful ingrowing toenails bilateral great toenails and thickness of the left second toenails. Patient states that both great toenails are pinching and curling and this has been been occurring for about a year. She states in the past she's taken Lamisil which has failed to improve her symptoms. Patient denies any drainage or tenderness however she relates that both of her feet hurt all the time and they hurt on the bottom and the top for about a year. She states she had a recent ankle injury back in February where she was seen at the ED and diagnosed with an ankle sprain. She continues to have pain along the lateral aspect of the fibula and posterior aspect of the right ankle    Past Medical History  Diagnosis Date  . Varicose veins   . Leg pain   . Bipolar depression        Medication List       This list is accurate as of: 06/09/13 11:33 AM.  Always use your most recent med list.               Fish Oil 1000 MG Caps  Take 4 capsules by mouth daily.     HYDROcodone-acetaminophen 5-325 MG per tablet  Commonly known as:  NORCO/VICODIN  Take 1-2 tablets by mouth every 6 (six) hours as needed for pain.     nicotine 10 MG inhaler  Commonly known as:  NICOTROL  Inhale 1 puff into the lungs 6 (six) times daily.     pramipexole 0.25 MG tablet  Commonly known as:  MIRAPEX  Take 0.25 mg by mouth 3 (three) times daily.     St Johns Wort 300 MG Caps  Take by mouth 2 (two) times daily.     vitamin B-12 1000 MCG tablet  Commonly known as:  CYANOCOBALAMIN  Take 1,000 mcg by mouth daily.     Vitamin D3 5000 UNITS Caps  Take 1 tablet by mouth daily.     WOMENS 50+ ADVANCED  PO  Take 1 tablet by mouth daily.         Past Surgical History  Procedure Laterality Date  . Abdominal hysterectomy  1984    vaginal hysterectomy  . Carpal tunnel release  2000    Bilateral       Review of Systems  DATA OBTAINED: from patient intake form GENERAL: Feels well no fevers, + fatigue, no changes in appetite SKIN: No itching, no rashes, no open lesions, no wounds EYES: No eye pain,no redness, no discharge EARS: No earache,no ringing of ears, no recent change in hearing NOSE: No congestion, no drainage, no bleeding  MOUTH/THROAT: No mouth pain, No sore throat, No difficulty chewing or swallowing  RESPIRATORY: No cough, no wheezing, no SOB CARDIAC: No chest pain,no heart palpitations,no new onset lower extremity edema, + leg swelling even after vein procedure- relieved by elevation GI: No abdominal pain, No Nausea, no vomiting, no diarrhea, no heartburn or no reflux  GU: No dysuria, no increased frequency or urgency MUSCULOSKELETAL: No unrelieved bone/joint pain,  NEUROLOGIC: Awake, alert, appropriate to situation, No change in mental status.  PSYCHIATRIC: No overt anxiety or sadness.No behavior issue. + depression/anxiety AMBULATION:  Ambulates unassisted  Filed Vitals:   06/09/13 1002  BP: 114/65  Pulse: 76  Resp: 18    Physical Exam  GENERAL APPEARANCE: Alert, conversant. Appropriately groomed. No acute distress.  VASCULAR: Pedal pulses palpable and strong bilateral.  Capillary refill time is immediate to all digits,  Proximal to distal cooling it warm to warm.  Digital hair growth is present bilateral multiple varicosities to bilateral legs are noted. NEUROLOGIC: sensation is intact epicritically and protectively to 5.07 monofilament at 5/5 sites bilateral.  Light touch is intact bilateral, vibratory sensation intact bilateral, achilles tendon reflex is intact bilateral.  MUSCULOSKELETAL: acceptable muscle strength, tone and stability bilateral.  Hypermobile  flat feet are noted. Underlapped fourth digits noted bilateral. Discomfort along the posterior tibial tendon at its insertion are also seen. Palpable navicular also noted bilateral. Discomfort along the lateral aspect of the right fibula is also present. Swelling in this area is noted compared to the left. Some discomfort along the posterior aspect of the talus is also seen with forced plantarflexion. DERMATOLOGIC: skin color, texture, and turger are within normal limits. Thickness discoloration and incurvation are noted bilateral hallux nails. Left second toenail is a slight yellowish discoloration present as well.  Assessment  1)Onychomycosis bilateral first left second with incurvated medial and lateral nail borders bilateral first. 2) right ankle sprain with os trigonum 3) pes planovalgus deformity bilateral.   Plan Recommended taking a sample of the nail and sending to [facility] for culture and deciding appropriate oral therapy. Recommended power step inserts for her posterior tibial tendon deformity as well as discussion of ankle issues with Dr. Victorino Dike at [facility] should she wish to pursue her right ankle pain.     Delories Heinz, DPM

## 2013-06-09 NOTE — Patient Instructions (Signed)
Onychomycosis/Fungal Toenails  WHAT IS IT? An infection that lies within the keratin of your nail plate that is caused by a fungus.  WHY ME? Fungal infections affect all ages, sexes, races, and creeds.  There may be many factors that predispose you to a fungal infection such as age, coexisting medical conditions such as diabetes, or an autoimmune disease; stress, medications, fatigue, genetics, etc.  Bottom line: fungus thrives in a warm, moist environment and your shoes offer such a location.  IS IT CONTAGIOUS? Theoretically, yes.  You do not want to share shoes, nail clippers or files with someone who has fungal toenails.  Walking around barefoot in the same room or sleeping in the same bed is unlikely to transfer the organism.  It is important to realize, however, that fungus can spread easily from one nail to the next on the same foot.  HOW DO WE TREAT THIS?  There are several ways to treat this condition.  Treatment may depend on many factors such as age, medications, pregnancy, liver and kidney conditions, etc.  It is best to ask your doctor which options are available to you.  1. No treatment.   Unlike many other medical concerns, you can live with this condition.  However for many people this can be a painful condition and may lead to ingrown toenails or a bacterial infection.  It is recommended that you keep the nails cut short to help reduce the amount of fungal nail. 2. Topical treatment.  These range from herbal remedies to prescription strength nail lacquers.  About 40-50% effective, topicals require twice daily application for approximately 9 to 12 months or until an entirely new nail has grown out.  The most effective topicals are medical grade medications available through physicians offices. 3. Oral antifungal medications.  With an 80-90% cure rate, the most common oral medication requires 3 to 4 months of therapy and stays in your system for a year as the new nail grows out.  Oral  antifungal medications do require blood work to make sure it is a safe drug for you.  A liver function panel will be performed prior to starting the medication and after the first month of treatment.  It is important to have the blood work performed to avoid any harmful side effects.  In general, this medication safe but blood work is required. 4. Laser Therapy.  This treatment is performed by applying a specialized laser to the affected nail plate.  This therapy is noninvasive, fast, and non-painful.  It is not covered by insurance and is therefore, out of pocket.  The [facility] is the only practice in the area to offer this therapy. 5. Permanent Nail Avulsion.  Removing the entire nail so that a new nail will not grow back. 

## 2013-07-27 ENCOUNTER — Telehealth: Payer: Self-pay | Admitting: *Deleted

## 2013-08-27 NOTE — Telephone Encounter (Signed)
Joyce Reyes called pt with results after discussing with Dr Irving ShowsEgerton

## 2013-12-18 ENCOUNTER — Encounter: Payer: Self-pay | Admitting: Podiatrist

## 2014-02-21 IMAGING — CR DG ANKLE COMPLETE 3+V*R*
3 series · 3 of 3 positions shown · non-contrast
Comparison: None.

CLINICAL DATA: Pain post trauma

RIGHT ANKLE - COMPLETE 3+ VIEW

[t ankle joint ap right]
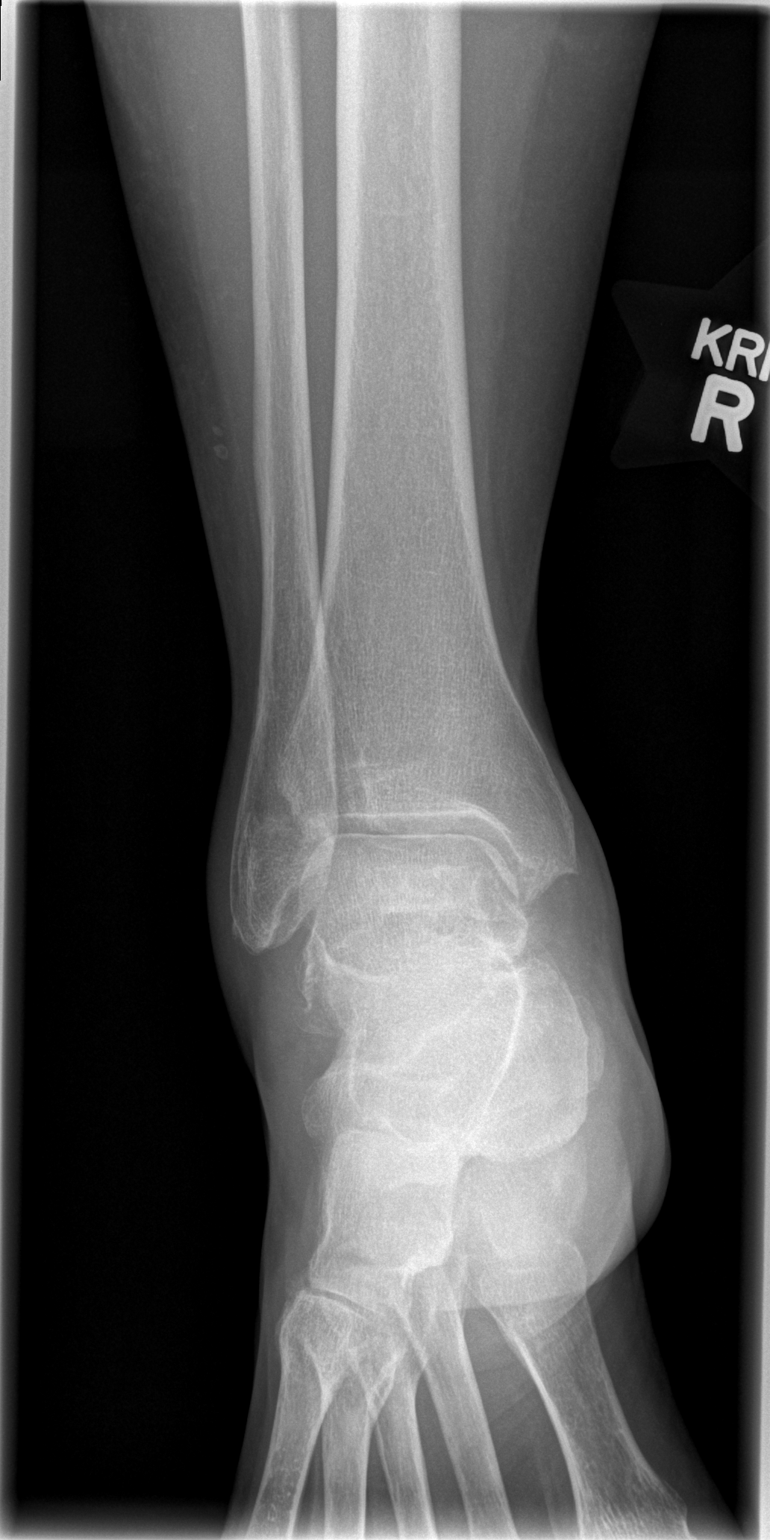

[t ankle joint oblique right]
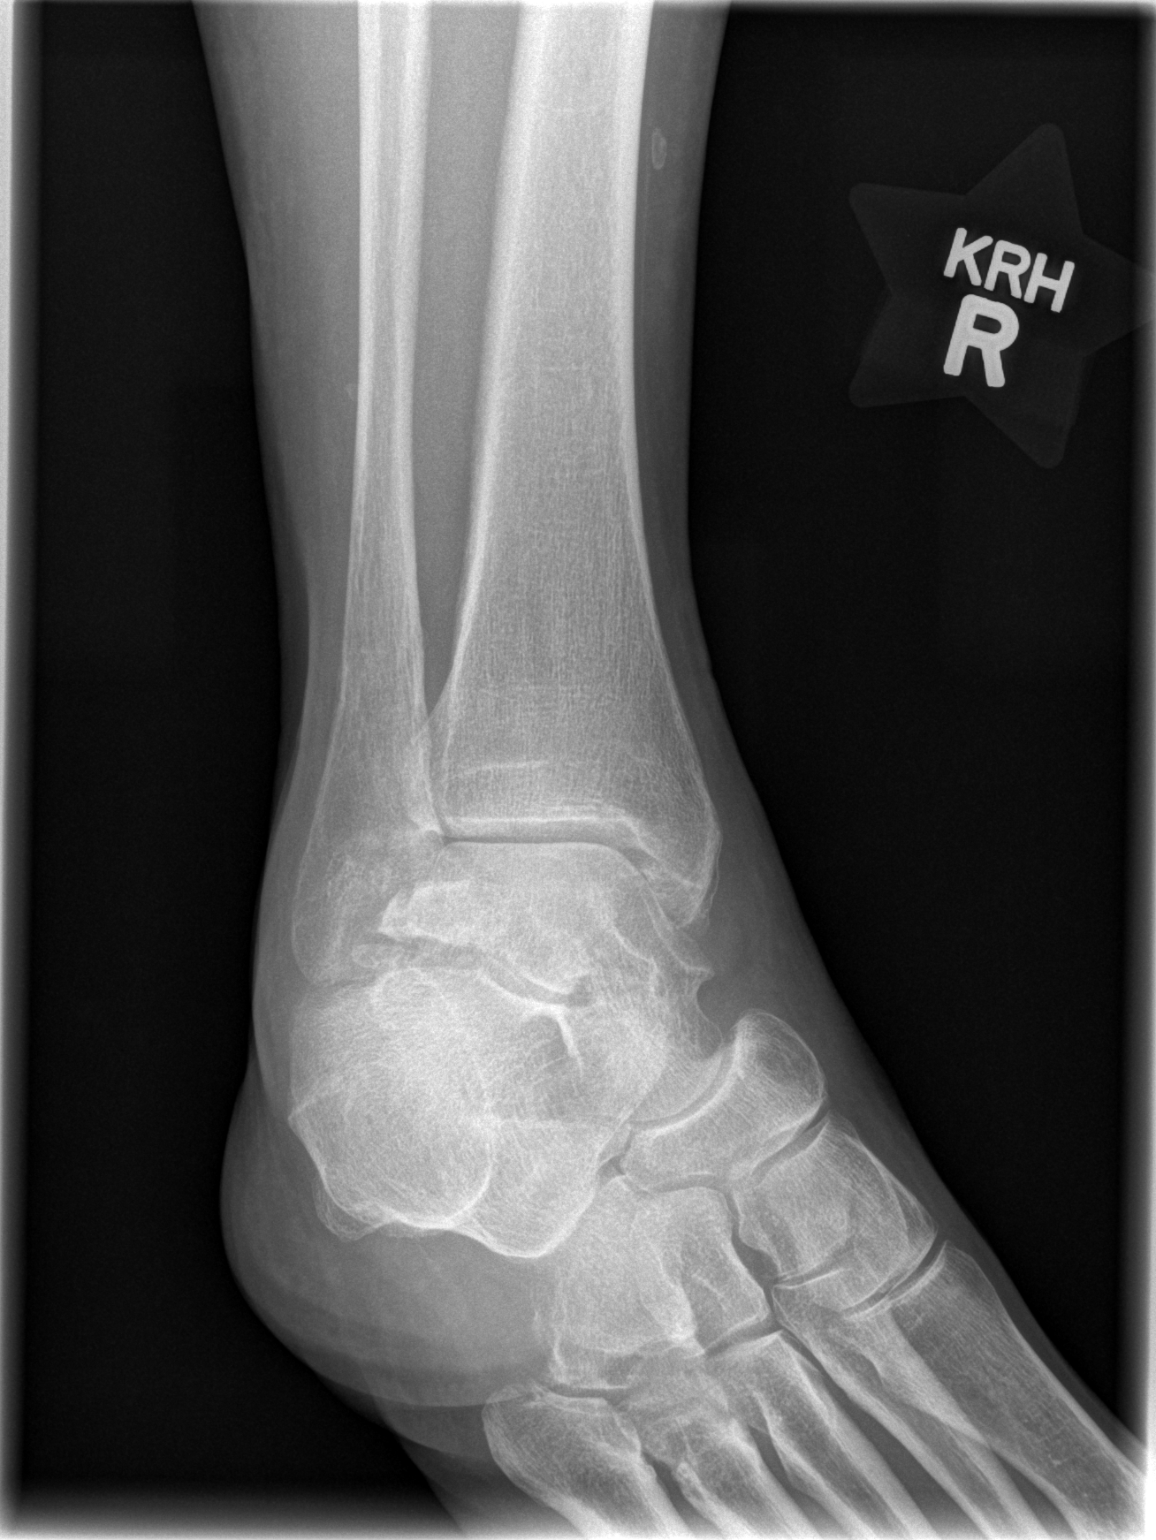

[t ankle joint lat right]
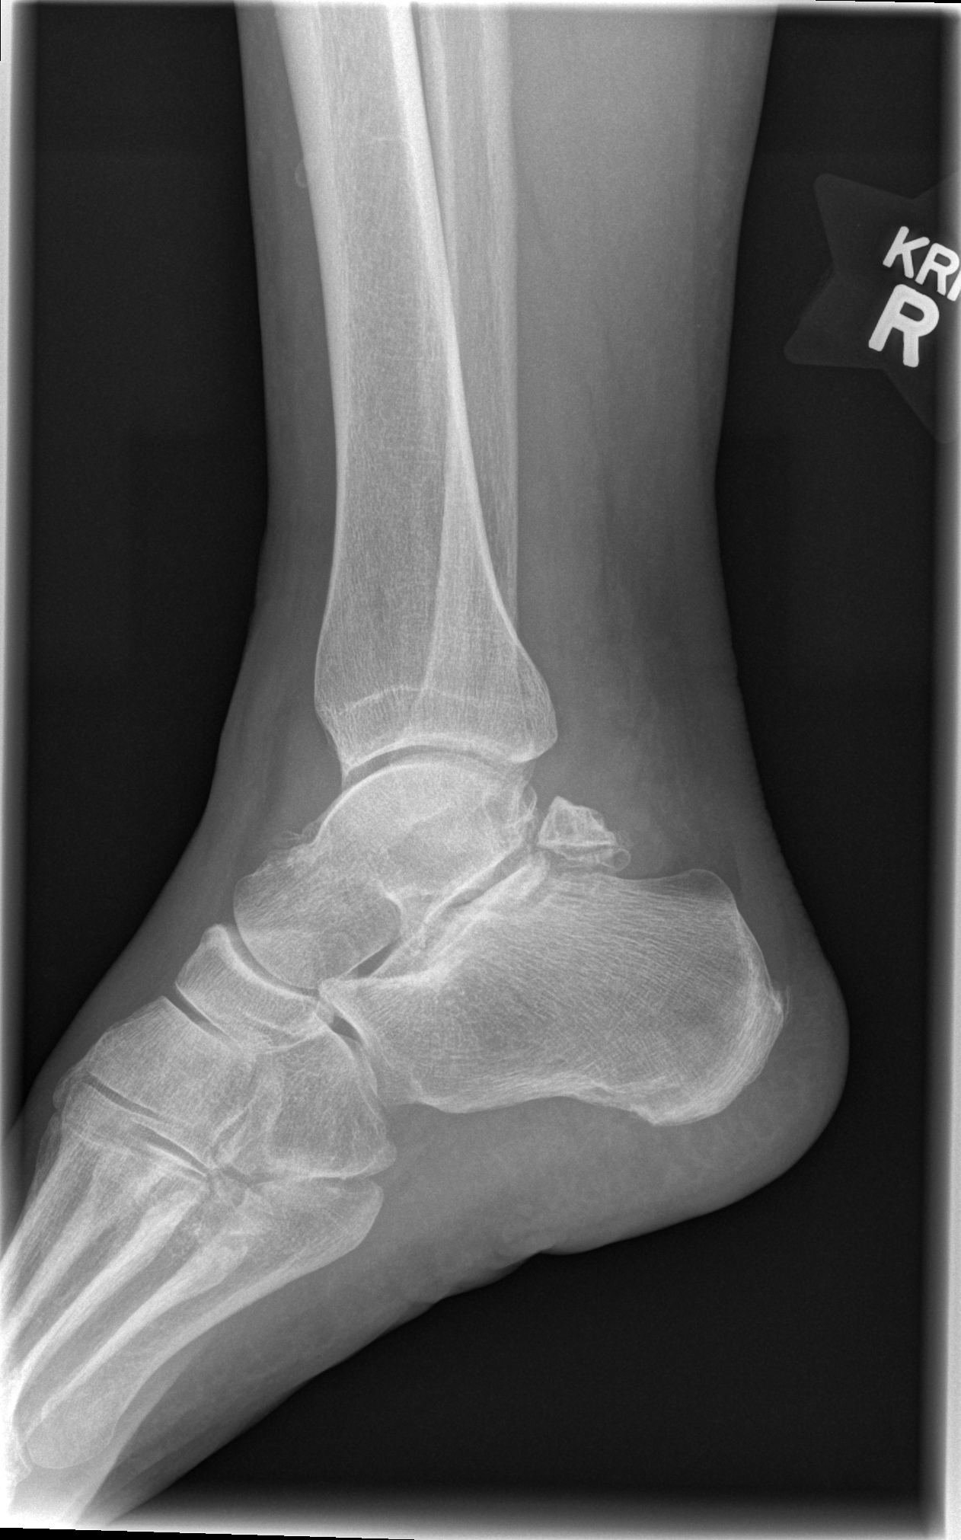

[3 of 3 positions shown; findings below may reference images not displayed]

FINDINGS: Frontal, oblique, and lateral views were obtained.  There
is no acute fracture. There is a small joint effusion.  Ankle
mortise appears intact.

There is evidence of old trauma along the posterior calcaneus with
remodelling and sclerosis.  There is osteoarthritic change in the
ankle mortise region, primarily medially.  No erosive change or
bony destruction.
IMPRESSION: Osteoarthritic change. Evidence of old trauma along the posterior
calcaneus.  No acute fracture apparent.  Small joint effusion, a
finding that could indicate underlying ligamentous injury.

## 2014-05-24 ENCOUNTER — Other Ambulatory Visit: Payer: Self-pay | Admitting: Specialist

## 2014-05-24 DIAGNOSIS — M25571 Pain in right ankle and joints of right foot: Secondary | ICD-10-CM

## 2014-05-26 ENCOUNTER — Other Ambulatory Visit: Payer: Self-pay | Admitting: Orthopedic Surgery

## 2014-05-26 DIAGNOSIS — M25571 Pain in right ankle and joints of right foot: Secondary | ICD-10-CM

## 2014-05-27 ENCOUNTER — Ambulatory Visit
Admission: RE | Admit: 2014-05-27 | Discharge: 2014-05-27 | Disposition: A | Discharge status: Home / Self Care | Payer: BC Managed Care – PPO | Source: Ambulatory Visit | Attending: Specialist | Admitting: Specialist

## 2014-05-27 DIAGNOSIS — M25571 Pain in right ankle and joints of right foot: Secondary | ICD-10-CM

## 2014-06-29 DIAGNOSIS — M6588 Other synovitis and tenosynovitis, other site: Secondary | ICD-10-CM

## 2015-11-08 IMAGING — CT CT ANKLE*R* W/O CM
1 of 4 series · 3 of 14 positions shown, 4 images · non-contrast
Comparison: Radiographs 09/09/2012

CLINICAL DATA: Ankle/hindfoot pain.  History of subtalar fusion.

EXAM:
CT OF THE RIGHT ANKLE WITHOUT CONTRAST
TECHNIQUE: Multidetector CT imaging of the right ankle was performed according
to the standard protocol. Multiplanar CT image reconstructions were
also generated.

[Series 105: axial st · axial · 0.35mm/px · z∈[-61,+11]mm · 3 of 74 slices shown, 4 images]
[im 19/74  soft-tissue]
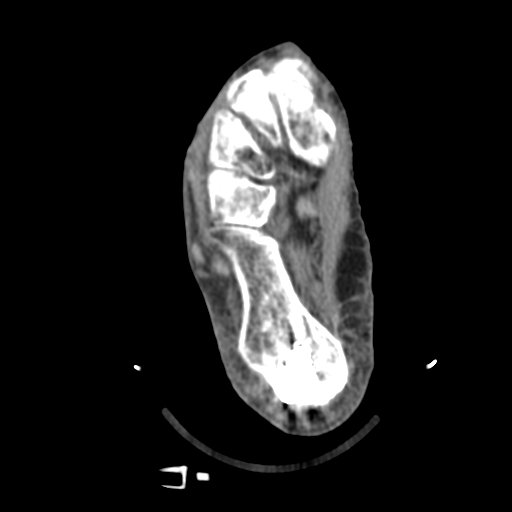
[im 19/74  bone]
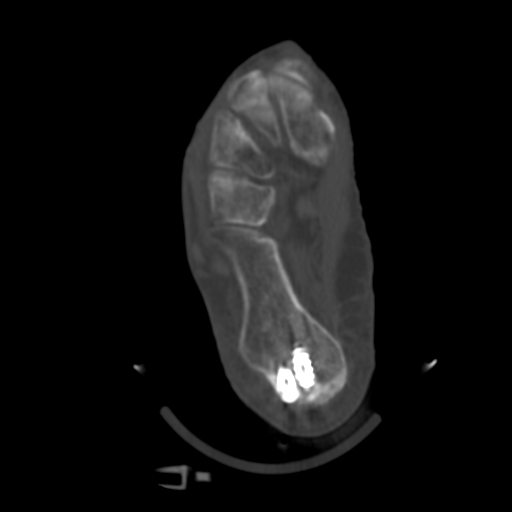
[im 37/74  bone]
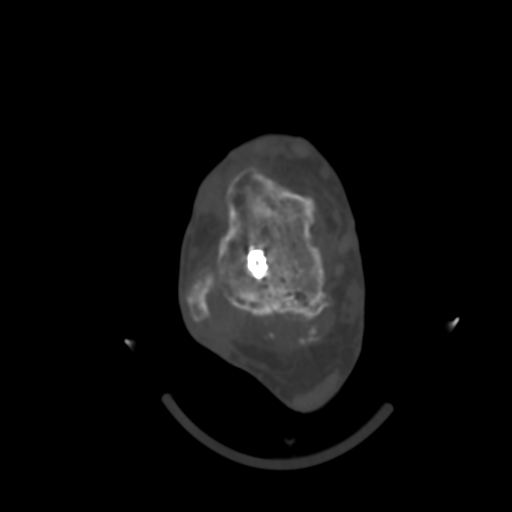
[im 55/74  bone]
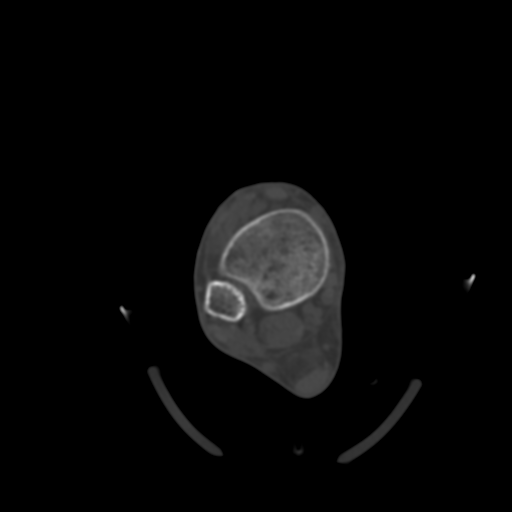

[3 of 14 positions shown; findings below may reference images not displayed]

FINDINGS: There are 2 long cannulated screws transfixing the talus and
calcaneus. No complicating features such as loosening or fracture.
The posterior talocalcaneal facet is solidly fused. The middle facet
is not fused and the anterior facet is not fused. Mild to moderate
degenerative changes involving the middle facet.

The tibiotalar joint is maintained. Mild degenerative changes. No
joint effusion or osteochondral lesion.

There are numerous small calcified or ossified bodies in the sinus
tarsi. These may be loose fragments of bone related to the fusion.
The midfoot bony structures are intact. No significant degenerative
changes or obvious stress fracture.
IMPRESSION: Surgical changes related to a prior subtalar fusion. The hardware is
intact without complicating features.

Solid fusion at the posterior talocalcaneal facet. The anterior and
middle facets are not fused.

Mild degenerative changes at the tibiotalar joint but no fracture,
joint effusion or osteochondral lesion.

## 2023-03-04 ENCOUNTER — Encounter: Payer: Self-pay | Admitting: Gastroenterology

## 2023-05-16 ENCOUNTER — Ambulatory Visit: Payer: 59 | Admitting: Gastroenterology
